# Patient Record
Sex: Female | Born: 1955 | Hispanic: No | Smoking: Former smoker
Health system: Southern US, Community
[De-identification: ages and names within clinical notes are randomized; demographics above are authoritative.]

## PROBLEM LIST (undated history)

## (undated) DIAGNOSIS — D693 Immune thrombocytopenic purpura: Secondary | ICD-10-CM

## (undated) DIAGNOSIS — I10 Essential (primary) hypertension: Secondary | ICD-10-CM

## (undated) DIAGNOSIS — M329 Systemic lupus erythematosus, unspecified: Secondary | ICD-10-CM

## (undated) DIAGNOSIS — E785 Hyperlipidemia, unspecified: Secondary | ICD-10-CM

## (undated) DIAGNOSIS — L98499 Non-pressure chronic ulcer of skin of other sites with unspecified severity: Secondary | ICD-10-CM

## (undated) HISTORY — DX: Immune thrombocytopenic purpura: D69.3

## (undated) HISTORY — DX: Hyperlipidemia, unspecified: E78.5

---

## 2009-12-14 ENCOUNTER — Encounter: Payer: Self-pay | Admitting: Cardiovascular Disease

## 2009-12-29 ENCOUNTER — Encounter: Payer: Self-pay | Admitting: Cardiovascular Disease

## 2010-01-21 ENCOUNTER — Ambulatory Visit: Payer: Self-pay | Admitting: Oncology

## 2010-02-01 ENCOUNTER — Encounter: Payer: Self-pay | Admitting: Cardiovascular Disease

## 2010-02-01 LAB — CBC WITH DIFFERENTIAL/PLATELET
BASO%: 0.6 % (ref 0.0–2.0)
Eosinophils Absolute: 0.1 10*3/uL (ref 0.0–0.5)
MONO#: 0.3 10*3/uL (ref 0.1–0.9)
MONO%: 9.1 % (ref 0.0–14.0)
NEUT#: 1.6 10*3/uL (ref 1.5–6.5)
RBC: 4.69 10*6/uL (ref 3.70–5.45)
RDW: 13.6 % (ref 11.2–14.5)
WBC: 3.8 10*3/uL — ABNORMAL LOW (ref 3.9–10.3)

## 2010-02-01 LAB — CHCC SMEAR

## 2010-02-01 LAB — COMPREHENSIVE METABOLIC PANEL
AST: 23 U/L (ref 0–37)
BUN: 14 mg/dL (ref 6–23)
Calcium: 9.3 mg/dL (ref 8.4–10.5)
Chloride: 104 mEq/L (ref 96–112)
Creatinine, Ser: 0.94 mg/dL (ref 0.40–1.20)
Glucose, Bld: 102 mg/dL — ABNORMAL HIGH (ref 70–99)

## 2010-02-01 LAB — SEDIMENTATION RATE: Sed Rate: 14 mm/hr (ref 0–22)

## 2010-02-01 LAB — MORPHOLOGY: RBC Comments: NORMAL

## 2010-02-03 ENCOUNTER — Encounter: Payer: Self-pay | Admitting: Cardiovascular Disease

## 2010-02-18 ENCOUNTER — Encounter: Payer: Self-pay | Admitting: Cardiovascular Disease

## 2010-02-25 ENCOUNTER — Ambulatory Visit: Payer: Self-pay | Admitting: Oncology

## 2010-03-23 DIAGNOSIS — R079 Chest pain, unspecified: Secondary | ICD-10-CM

## 2010-03-23 DIAGNOSIS — R0602 Shortness of breath: Secondary | ICD-10-CM

## 2010-03-23 DIAGNOSIS — R609 Edema, unspecified: Secondary | ICD-10-CM

## 2010-03-23 DIAGNOSIS — I10 Essential (primary) hypertension: Secondary | ICD-10-CM | POA: Insufficient documentation

## 2010-03-23 DIAGNOSIS — B354 Tinea corporis: Secondary | ICD-10-CM | POA: Insufficient documentation

## 2010-03-28 ENCOUNTER — Ambulatory Visit: Payer: Self-pay | Admitting: Oncology

## 2010-04-15 ENCOUNTER — Encounter (INDEPENDENT_AMBULATORY_CARE_PROVIDER_SITE_OTHER): Payer: Self-pay | Admitting: *Deleted

## 2010-04-27 ENCOUNTER — Ambulatory Visit: Payer: Self-pay | Admitting: Oncology

## 2010-12-13 NOTE — Consult Note (Signed)
Summary: [facility]  [facility]   Imported By: Marylou Mccoy 03/23/2010 10:58:25  _____________________________________________________________________  External Attachment:    Type:   Image     Comment:   External Document

## 2010-12-13 NOTE — Letter (Signed)
Summary: Appointment - Missed  [facility], Main Office  [address]   [place], [place] [address]   Phone: [phone]  Fax: [phone]     April 15, 2010 MRN: [id number]   Laurie Castaneda [address] [place], [place]  [address]   Dear Ms. Yetta Barre,  Our records indicate you missed your appointment on 04/12/10 with Dr. Eden Emms. It is very important that we reach you to reschedule this appointment. We look forward to participating in your health care needs. Please contact us at the number listed above at your earliest convenience to reschedule this appointment.     Sincerely,   [facility] Scheduling Team

## 2010-12-13 NOTE — Consult Note (Signed)
Summary: [facility]  [facility]   Imported By: Marylou Mccoy 03/23/2010 10:59:29  _____________________________________________________________________  External Attachment:    Type:   Image     Comment:   External Document

## 2010-12-13 NOTE — Consult Note (Signed)
Summary: [facility]  [facility]   Imported By: Marylou Mccoy 03/23/2010 10:57:19  _____________________________________________________________________  External Attachment:    Type:   Image     Comment:   External Document

## 2011-03-18 ENCOUNTER — Emergency Department (HOSPITAL_COMMUNITY)
Admission: EM | Admit: 2011-03-18 | Discharge: 2011-03-19 | Disposition: A | Discharge status: Home / Self Care | Payer: Medicaid Other | Attending: Emergency Medicine | Admitting: Emergency Medicine

## 2011-03-18 DIAGNOSIS — D696 Thrombocytopenia, unspecified: Secondary | ICD-10-CM | POA: Insufficient documentation

## 2011-03-18 DIAGNOSIS — I2510 Atherosclerotic heart disease of native coronary artery without angina pectoris: Secondary | ICD-10-CM | POA: Insufficient documentation

## 2011-03-18 DIAGNOSIS — N949 Unspecified condition associated with female genital organs and menstrual cycle: Secondary | ICD-10-CM | POA: Insufficient documentation

## 2011-03-18 DIAGNOSIS — N938 Other specified abnormal uterine and vaginal bleeding: Secondary | ICD-10-CM | POA: Insufficient documentation

## 2011-03-19 LAB — URINALYSIS, ROUTINE W REFLEX MICROSCOPIC
Nitrite: NEGATIVE
Specific Gravity, Urine: 1.018 (ref 1.005–1.030)
Urobilinogen, UA: 0.2 mg/dL (ref 0.0–1.0)

## 2011-03-19 LAB — WET PREP, GENITAL
Trich, Wet Prep: NONE SEEN
Yeast Wet Prep HPF POC: NONE SEEN

## 2011-03-19 LAB — URINE MICROSCOPIC-ADD ON

## 2011-03-19 LAB — BASIC METABOLIC PANEL
BUN: 12 mg/dL (ref 6–23)
Calcium: 9.2 mg/dL (ref 8.4–10.5)
GFR calc non Af Amer: >60 mL/min (ref >60)
Glucose, Bld: 86 mg/dL (ref 70–99)
Sodium: 136 mEq/L (ref 135–145)

## 2011-03-19 LAB — CBC
Platelets: 54 10*3/uL — ABNORMAL LOW (ref 150–400)
RDW: 13.9 % (ref 11.5–15.5)
WBC: 4.4 10*3/uL (ref 4.0–10.5)

## 2011-03-19 LAB — DIFFERENTIAL
Basophils Absolute: 0 10*3/uL (ref 0.0–0.1)
Eosinophils Absolute: 0.1 10*3/uL (ref 0.0–0.7)
Eosinophils Relative: 1 % (ref 0–5)

## 2011-04-05 ENCOUNTER — Other Ambulatory Visit: Payer: Self-pay | Admitting: Nephrology

## 2011-04-05 ENCOUNTER — Ambulatory Visit
Admission: RE | Admit: 2011-04-05 | Discharge: 2011-04-05 | Disposition: A | Discharge status: Home / Self Care | Payer: Medicaid Other | Source: Ambulatory Visit | Attending: Nephrology | Admitting: Nephrology

## 2011-04-05 DIAGNOSIS — M25562 Pain in left knee: Secondary | ICD-10-CM

## 2011-04-05 DIAGNOSIS — M25561 Pain in right knee: Secondary | ICD-10-CM

## 2011-06-09 IMAGING — CR DG KNEE 1-2V*R*
3 series · 3 of 3 positions shown · non-contrast
Comparison: None.

CLINICAL DATA: Knee pain

RIGHT KNEE - 1-2 VIEW

[view not recorded (1 of 3)]
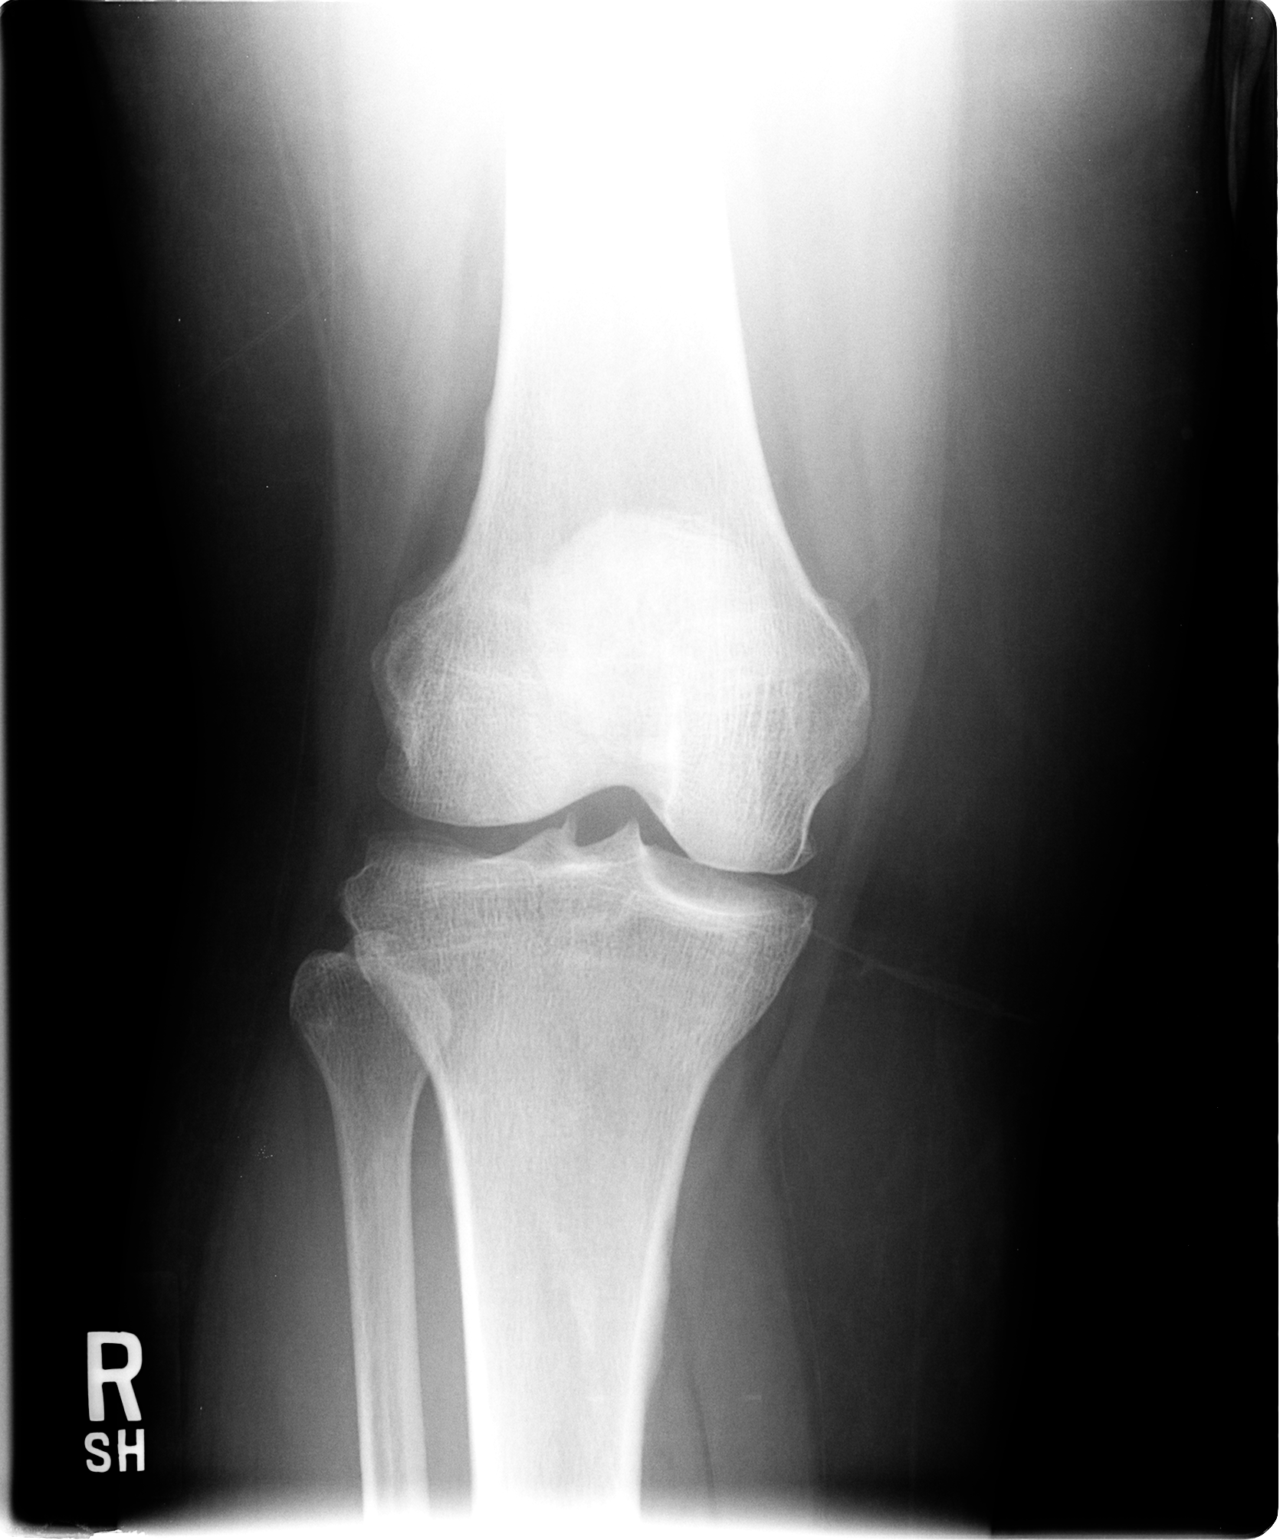

[view not recorded (2 of 3)]
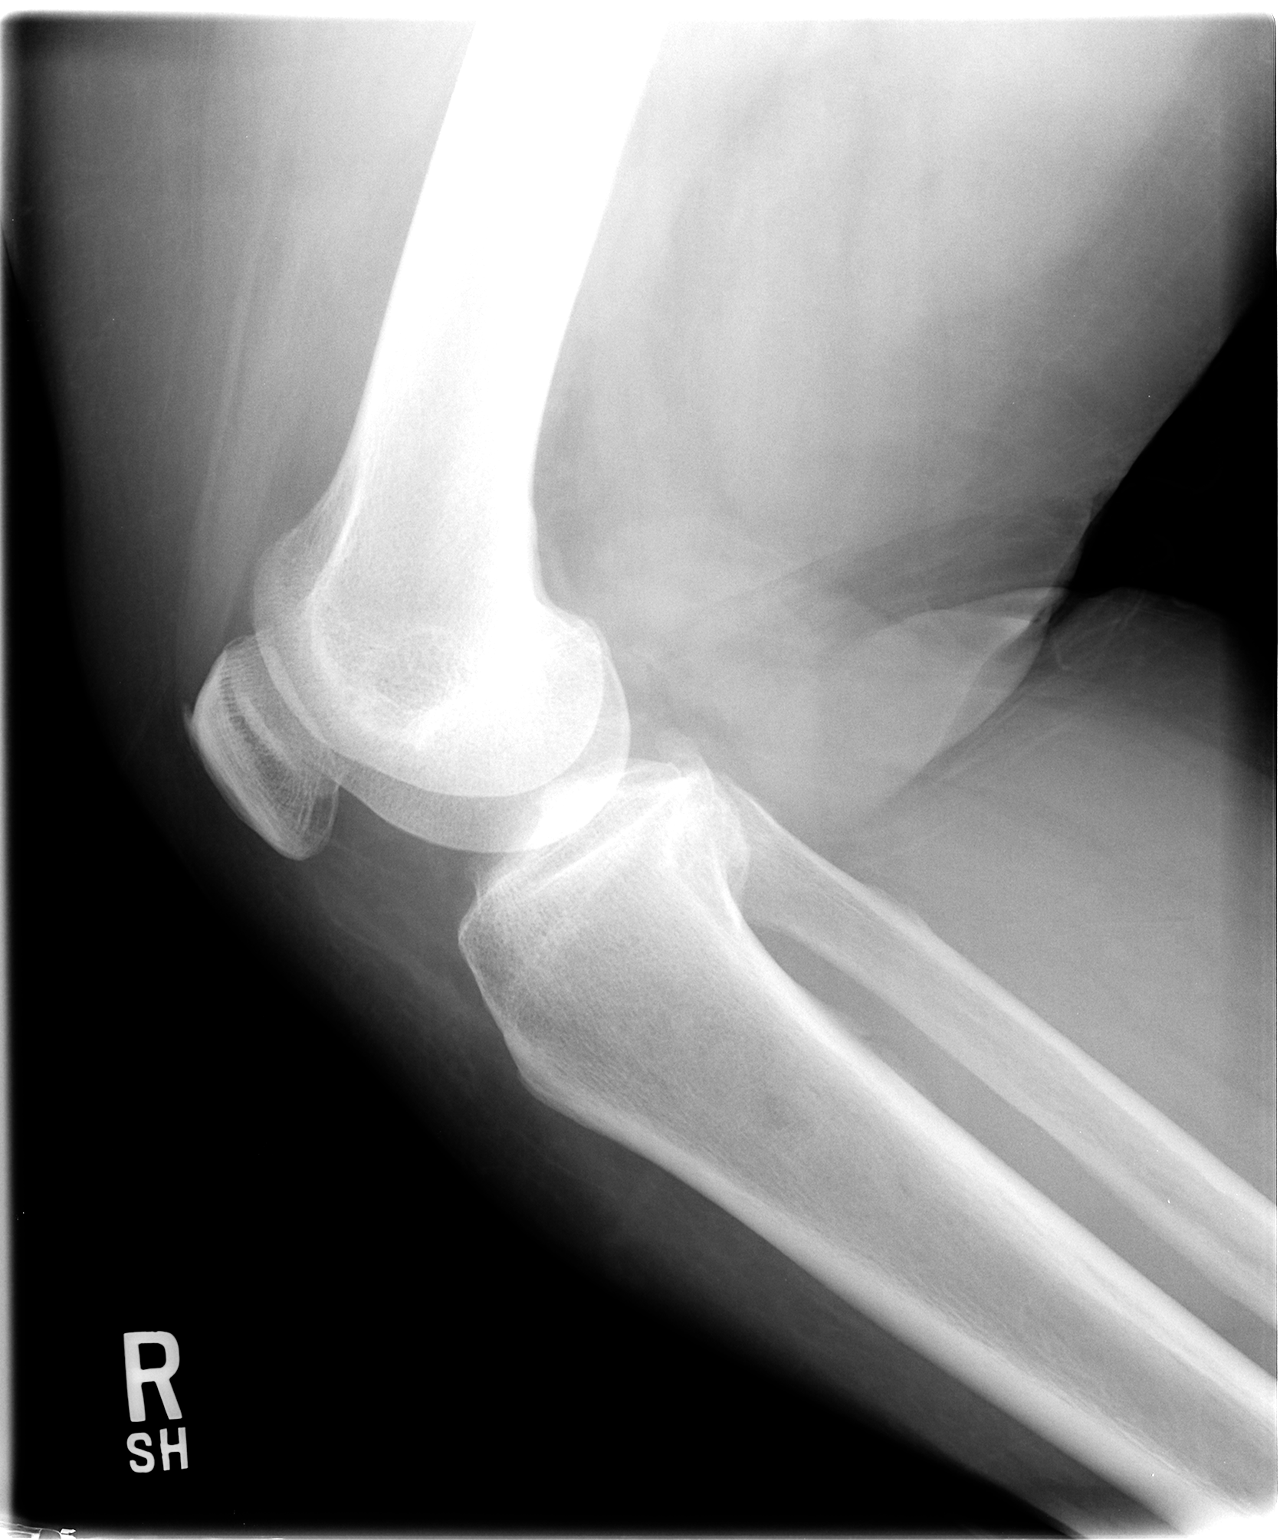

[view not recorded (3 of 3)]
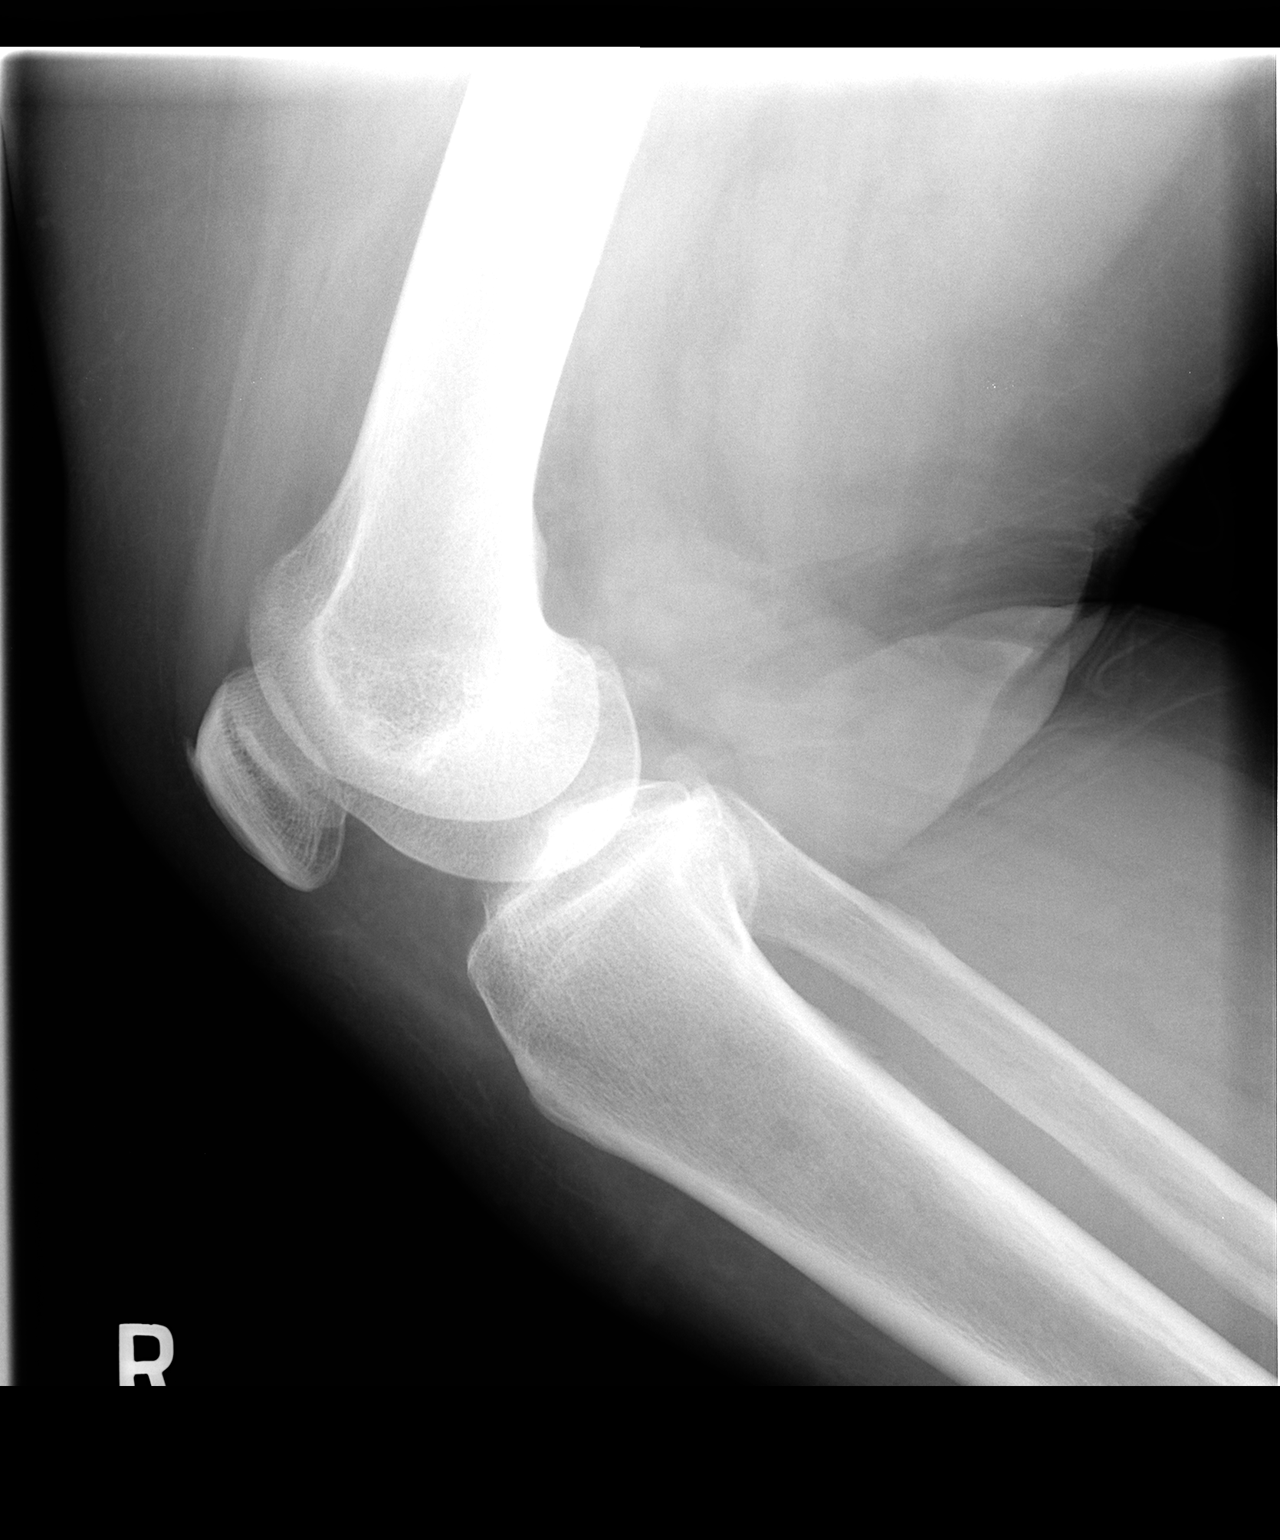

[3 of 3 positions shown; findings below may reference images not displayed]

FINDINGS: There is narrowing of the medial compartment with
associated osteophytosis.  There is mild spurring of the superior
aspect of the patella.  No joint effusion
IMPRESSION: Mild osteoarthritis of the medial compartment and patellofemoral
compartment.

## 2018-10-28 ENCOUNTER — Encounter (HOSPITAL_COMMUNITY): Payer: Self-pay | Admitting: Emergency Medicine

## 2018-10-28 ENCOUNTER — Emergency Department (HOSPITAL_COMMUNITY)
Admission: EM | Admit: 2018-10-28 | Discharge: 2018-10-28 | Disposition: A | Discharge status: Home / Self Care | Payer: Medicaid Other | Attending: Emergency Medicine | Admitting: Emergency Medicine

## 2018-10-28 DIAGNOSIS — M25561 Pain in right knee: Secondary | ICD-10-CM | POA: Insufficient documentation

## 2018-10-28 DIAGNOSIS — M3210 Systemic lupus erythematosus, organ or system involvement unspecified: Secondary | ICD-10-CM | POA: Diagnosis not present

## 2018-10-28 DIAGNOSIS — I10 Essential (primary) hypertension: Secondary | ICD-10-CM | POA: Diagnosis not present

## 2018-10-28 HISTORY — DX: Systemic lupus erythematosus, unspecified: M32.9

## 2018-10-28 HISTORY — DX: Non-pressure chronic ulcer of skin of other sites with unspecified severity: L98.499

## 2018-10-28 HISTORY — DX: Essential (primary) hypertension: I10

## 2018-10-28 MED ORDER — HYDROCODONE-ACETAMINOPHEN 5-325 MG PO TABS: 1.0000 | ORAL_TABLET | ORAL | 0 refills | Status: DC | PRN

## 2018-10-28 NOTE — Discharge Instructions (Addendum)
Please read attached information. If you experience any new or worsening signs or symptoms please return to the emergency room for evaluation. Please follow-up with your primary care provider or specialist as discussed. Please use medication prescribed only as directed and discontinue taking if you have any concerning signs or symptoms.   °

## 2018-10-28 NOTE — ED Notes (Signed)
Pt stable, ambulatory, states understanding of discharge instructions 

## 2018-10-28 NOTE — ED Provider Notes (Signed)
[facility] Provider Note   CSN: [id number] Arrival date & time: 10/28/18  1222     History   Chief Complaint Chief Complaint  Patient presents with  . Knee Injury    HPI Laurie Burrowvelyn Rawe is a 62 y.o. female.  HPI   62 year old female presents today with complaints of right-sided knee pain.  Patient notes 2 weeks of worsening right knee pain worse with ambulation.  She notes history of arthritis in the [facility].  She denies any recent trauma, fever, warmth or redness.  She notes she has nearly full flexion in the knee.  Patient notes a significant past medical history of lupus.  Past Medical History:  Diagnosis Date  . Hypertension   . Lupus (HCC)   . Ulcer of abdomen wall [facility](HCC)     Patient Active Problem List   Diagnosis Date Noted  . TINEA CORPORIS 03/23/2010  . HYPERTENSION 03/23/2010  . EDEMA 03/23/2010  . SHORTNESS OF BREATH 03/23/2010  . CHEST PAIN 03/23/2010    History reviewed. No pertinent surgical history.   OB History   No obstetric history on file.      Home Medications    Prior to Admission medications   Medication Sig Start Date End Date Taking? Authorizing Provider  HYDROcodone-acetaminophen (NORCO/VICODIN) 5-325 MG tablet Take 1 tablet by mouth every 4 (four) hours as needed. 10/28/18   Eyvonne MechanicHedges, Jaliza Seifried, PA-C    Family History History reviewed. No pertinent family history.  Social History Social History   Tobacco Use  . Smoking status: Not on file  Substance Use Topics  . Alcohol use: Not on file  . Drug use: Not on file     Allergies   Shellfish allergy   Review of Systems Review of Systems  All other systems reviewed and are negative.   Physical Exam Updated Vital Signs BP (!) 172/100 (BP Location: Right Arm)   Pulse 93   Temp 98.4 F (36.9 C) (Oral)   Resp 18   SpO2 99%   Physical Exam Vitals signs and nursing note reviewed.    Constitutional:      Appearance: She is well-developed.  HENT:     Head: Normocephalic and atraumatic.  Eyes:     General: No scleral icterus.       Right eye: No discharge.        Left eye: No discharge.     Conjunctiva/sclera: Conjunctivae normal.     Pupils: Pupils are equal, round, and reactive to light.  Neck:     Musculoskeletal: Normal range of motion.     Vascular: No JVD.     Trachea: No tracheal deviation.  Pulmonary:     Effort: Pulmonary effort is normal.     Breath sounds: No stridor.  Musculoskeletal:     Comments: Bilateral knees atraumatic without swelling or edema, no significant tenderness to palpation, some pain with range of motion greater than 90 degrees of flexion noted no laxity noted  Neurological:     Mental Status: She is alert and oriented to person, place, and time.     Coordination: Coordination normal.  Psychiatric:        Behavior: Behavior normal.        Thought Content: Thought content normal.        Judgment: Judgment normal.     ED Treatments / Results  Labs (all labs ordered are listed, but only abnormal results  are displayed) Labs Reviewed - No data to display  EKG None  Radiology No results found.  Procedures Procedures (including critical care time)  Medications Ordered in ED Medications - No data to display   Initial Impression / Assessment and Plan / ED Course  I have reviewed the triage vital signs and the nursing notes.  Pertinent labs & imaging results that were available during my care of the patient were reviewed by me and considered in my medical decision making (see chart for details).      Assessment/Plan: 62 year old female presents today with complaints of right-sided knee pain.  I have high suspicion for arthritis.  She does have x-rays from years past with signs of arthritis.  No recent falls no signs of infection likely worsening arthritic pain.  Patient is having difficulty sleeping at night secondary to  the pain I do find it reasonable to give her a short course of pain medication as she attempts to follow-up as an outpatient with orthopedic specialist.  No signs of septic arthritis, strict return precautions given.  Verbalized understanding and agreement to today's plan.  Discussed imaging versus not having meeting with the patient she will have imaging as an outpatient as this is nonemergent.    Final Clinical Impressions(s) / ED Diagnoses   Final diagnoses:  Right knee pain, unspecified chronicity    ED Discharge Orders         Ordered    HYDROcodone-acetaminophen (NORCO/VICODIN) 5-325 MG tablet  Every 4 hours PRN     10/28/18 1350           Eyvonne Mechanic, PA-C 10/28/18 1502    Eber Hong, MD 10/28/18 Ernestina [place]

## 2018-10-28 NOTE — ED Triage Notes (Signed)
Pt arrives pov with c/o of right knee pain for the last 2 weeks-pt reports no recent injury. Pt reports pain started in back of knee and feels swollen. Pt is ambulatory but reports increase pain with walking and steps.

## 2022-06-02 ENCOUNTER — Ambulatory Visit: Payer: Medicaid Other | Admitting: Cardiology

## 2022-06-02 ENCOUNTER — Encounter: Payer: Self-pay | Admitting: Cardiology

## 2022-06-02 ENCOUNTER — Inpatient Hospital Stay: Payer: Medicaid Other

## 2022-06-02 VITALS — BP 132/86 | HR 86 | Temp 98.0°F | Resp 16 | Ht 65.0 in | Wt 282.0 lb

## 2022-06-02 DIAGNOSIS — E7800 Pure hypercholesterolemia, unspecified: Secondary | ICD-10-CM

## 2022-06-02 DIAGNOSIS — I10 Essential (primary) hypertension: Secondary | ICD-10-CM

## 2022-06-02 DIAGNOSIS — IMO0002 Reserved for concepts with insufficient information to code with codable children: Secondary | ICD-10-CM

## 2022-06-02 DIAGNOSIS — R9431 Abnormal electrocardiogram [ECG] [EKG]: Secondary | ICD-10-CM

## 2022-06-02 DIAGNOSIS — D693 Immune thrombocytopenic purpura: Secondary | ICD-10-CM

## 2022-06-02 DIAGNOSIS — M329 Systemic lupus erythematosus, unspecified: Secondary | ICD-10-CM

## 2022-06-02 NOTE — Progress Notes (Signed)
ID:  Laurie Castaneda, DOB 07/16/1956, MRN [id number]  PCP:  Default, Provider, MD  Cardiologist:  Tessa Lerner, DO, [facility] (established care 06/02/2022)  REASON FOR CONSULT: Evaluation of Atrial Fibrillation  REQUESTING PHYSICIAN:  Norm Salt, PA [address] [place],  [place] [address]  Chief Complaint  Patient presents with   Atrial Fibrillation   Hypertension   New Patient (Initial Visit)    Referred by Norva Riffle, PA    HPI  Laurie Castaneda is a 66 y.o. African-American female who presents to the clinic for evaluation of atrial fibrillation at the request of Norm Salt, [place]. Her past medical history and cardiovascular risk factors include: Prediabetes, hyperlipidemia, idiopathic thrombocytopenic purpura, systemic lupus, hypertension, obesity, former smoker.   Patient states that she has been experiencing palpitations more recently predominately at night, describes it as " shaking inside."  The symptoms last for a few minutes, no change in intensity, frequency, and the duration.  Patient denies any near-syncope or syncopal event.  She denies anginal discomfort or heart failure symptoms.  Patient states that recently she been drinking a significant amount of alcohol.  She was drinking 1/5 of vodka at least every 4 days or more.  She has not been cooking on a regular basis and has been eating TV dinners and junk food according to her.  She recently went to her PCP and given her symptoms had an EKG performed and was diagnosed with atrial fibrillation.  She was started on Eliquis for thromboembolic prophylaxis; however, has not started anticoagulation given her underlying ITP and the risk for bleeding.  FUNCTIONAL STATUS: No structured exercise program or daily routine.   ALLERGIES: Allergies  Allergen Reactions   Shellfish Allergy     MEDICATION LIST PRIOR TO VISIT: Current Meds  Medication Sig   amLODipine (NORVASC) 10 MG tablet Take 10 mg by mouth daily.    fluticasone (FLONASE) 50 MCG/ACT nasal spray Place 1 spray into both nostrils daily.   hydrOXYzine (ATARAX) 25 MG tablet Take 1-2 tablets by mouth in the morning and at bedtime.   omeprazole (PRILOSEC) 20 MG capsule Take 20 mg by mouth daily.   VENTOLIN HFA 108 (90 Base) MCG/ACT inhaler Inhale 2 puffs into the lungs 4 (four) times daily.     PAST MEDICAL HISTORY: Past Medical History:  Diagnosis Date   Diabetes mellitus without complication (HCC)    Hyperlipidemia    Hypertension    Idiopathic thrombocytopenic purpura (ITP) (HCC)    Lupus (HCC)    Ulcer of abdomen wall (HCC)     PAST SURGICAL HISTORY: History reviewed. No pertinent surgical history.  FAMILY HISTORY: The patient family history includes Cancer in her father.  SOCIAL HISTORY:  The patient  reports that she quit smoking about 14 years ago. Her smoking use included cigarettes. She has a 2.00 pack-year smoking history. She has never used smokeless tobacco. She reports that she does not currently use alcohol. She reports that she does not use drugs.  REVIEW OF SYSTEMS: Review of Systems  Cardiovascular:  Negative for chest pain, claudication, dyspnea on exertion, irregular heartbeat, leg swelling, near-syncope, orthopnea, palpitations, paroxysmal nocturnal dyspnea and syncope.  Respiratory:  Negative for shortness of breath.   Hematologic/Lymphatic: Negative for bleeding problem.  Musculoskeletal:  Negative for muscle cramps and myalgias.  Neurological:  Negative for dizziness and light-headedness.    PHYSICAL EXAM:    06/02/2022   11:16 AM 06/02/2022   11:13 AM 10/28/2018    1:51 PM  Vitals with BMI  Height  5\' 5"    Weight  282 lbs   BMI  46.93   Systolic 132 170  Diastolic 86 96 100  Pulse 86 87 93    CONSTITUTIONAL: Well-developed and well-nourished. No acute distress.  SKIN: Skin is warm and dry. No rash noted. No cyanosis. No pallor. No jaundice HEAD: Normocephalic and atraumatic.  EYES: No  scleral icterus, xanthelasmas bilaterally MOUTH/THROAT: Moist oral membranes.  NECK: No JVD present. No thyromegaly noted. No carotid bruits  CHEST Normal respiratory effort. No intercostal retractions  LUNGS: Clear to auscultation bilaterally.  No stridor. No wheezes. No rales.  CARDIOVASCULAR: Regular rate and rhythm, positive S1-S2, no murmurs rubs or gallops appreciated. ABDOMINAL: Obese, soft, nontender, nondistended, positive bowel sounds in all 4 quadrants, no apparent ascites.  EXTREMITIES: No peripheral edema, warm to touch. HEMATOLOGIC: No significant bruising NEUROLOGIC: Oriented to person, place, and time. Nonfocal. Normal muscle tone.  PSYCHIATRIC: Normal mood and affect. Normal behavior. Cooperative  CARDIAC DATABASE: EKG outside records:   EKG: 06/02/2022: Normal sinus rhythm, 86 bpm, without underlying ischemia injury pattern.  Echocardiogram: No results found for this or any previous visit from the past 1095 days.    Stress Testing: No results found for this or any previous visit from the past 1095 days.   Heart Catheterization: None  LABORATORY DATA:    Latest Ref Rng & Units 03/18/2011   11:59 PM 02/01/2010    1:43 PM  CBC  WBC 4.0 - 10.5 K/uL 4.4  3.8   Hemoglobin 12.0 - 15.0 g/dL 02/03/2010  27.0   Hematocrit 36.0 - 46.0 % 40.0  39.2   Platelets 150 - 400 K/uL 54  74        Latest Ref Rng & Units 03/18/2011   11:59 PM 02/01/2010    1:43 PM  CMP  Glucose 70 - 99 mg/dL 86  02/03/2010   BUN 6 - 23 mg/dL 12  14   Creatinine 0.4 - 1.2 mg/dL 093  8.18   Sodium 2.99 - 145 mEq/L 136  142   Potassium 3.5 - 5.1 mEq/L 3.6  4.1   Chloride 96 - 112 mEq/L 102  104   CO2 19 - 32 mEq/L 25  27   Calcium 8.4 - 10.5 mg/dL 9.2  9.3   Total Protein 6.0 - 8.3 g/dL  7.0   Total Bilirubin 0.3 - 1.2 mg/dL  0.4   Alkaline Phos 39 - 117 U/L  69   AST 0 - 37 U/L  23   ALT 0 - 35 U/L  20     Lipid Panel  No results found for: "CHOL", "TRIG", "HDL", "CHOLHDL", "VLDL", "LDLCALC",  "LDLDIRECT", "LABVLDL"  No components found for: "NTPROBNP" No results for input(s): "PROBNP" in the last 8760 hours. No results for input(s): "TSH" in the last 8760 hours.  BMP No results for input(s): "NA", "K", "CL", "CO2", "GLUCOSE", "BUN", "CREATININE", "CALCIUM", "GFRNONAA", "GFRAA" in the last 8760 hours.  HEMOGLOBIN A1C No results found for: "HGBA1C", "MPG"  External Labs: Collected: 05/24/2022 Total cholesterol 200, triglyceride 98, HDL 52, LDL 128, non-HDL 148 BUN 9, creatinine 07/25/2022. Sodium 140, potassium 4.4, chloride 103, AST 20, ALT 16, alkaline phosphatase 63. Hemoglobin 13.3 g/dL, hematocrit 6.96. A1c 6.4. TSH 2.25   IMPRESSION:    ICD-10-CM   1. Abnormal EKG  R94.31 EKG 12-Lead    PCV ECHOCARDIOGRAM COMPLETE    LONG TERM MONITOR (3-14 DAYS)    2. Benign hypertension  I10     3. Hypercholesteremia  E78.00 CT CARDIAC SCORING ([facility] LOCATIONS ONLY)    4. Lupus (HCC)  M32.9     5. Chronic ITP (idiopathic thrombocytopenia) (HCC)  D69.3        RECOMMENDATIONS: Laurie Castaneda is a 66 y.o. African-American female whose past medical history and cardiac risk factors include: Prediabtes, Non-insulin-dependent diabetes mellitus type 2, hyperlipidemia, idiopathic thrombocytopenic purpura, systemic lupus, hypertension, obesity.   Abnormal EKG Patient was referred to the practice for evaluation and management of atrial fibrillation. EKG performed at PCP office reviewed independently and it notes sinus rhythm with frequent PVCs & not Afib. Given her ITP she has not started Eliquis and the shared decision was to hold off for now.  EKG from PCP office noted above for reference.  14-day extended Holter monitor to evaluate for A-fib burden.  Echo will be ordered to evaluate for structural heart disease and left ventricular systolic function. To help reduce the incidence of atrial fibrillation patient is educated on the importance of weight loss, decreasing her alcohol  consumption, being screened for sleep apnea, and improving her modifiable cardiovascular risk factors. I will defer referring her to sleep medicine to PCP.  Benign hypertension Office BP are well controlled.  Medications reconciled.   Hypercholesteremia On physical examination she has bilateral xanthelasmas image uploaded into the media section. 10-year risk of ASCVD is 9%.   Most recent lipid profile reviewed. Patient is hesitant to be on statin medications. Therefore we will order a coronary calcium score for further risk stratification.  FINAL MEDICATION LIST END OF ENCOUNTER: No orders of the defined types were placed in this encounter.   Medications Discontinued During This Encounter  Medication Reason   HYDROcodone-acetaminophen (NORCO/VICODIN) 5-325 MG tablet    ELIQUIS 5 MG TABS tablet Patient Preference     Current Outpatient Medications:    amLODipine (NORVASC) 10 MG tablet, Take 10 mg by mouth daily., Disp: , Rfl:    fluticasone (FLONASE) 50 MCG/ACT nasal spray, Place 1 spray into both nostrils daily., Disp: , Rfl:    hydrOXYzine (ATARAX) 25 MG tablet, Take 1-2 tablets by mouth in the morning and at bedtime., Disp: , Rfl:    omeprazole (PRILOSEC) 20 MG capsule, Take 20 mg by mouth daily., Disp: , Rfl:    VENTOLIN HFA 108 (90 Base) MCG/ACT inhaler, Inhale 2 puffs into the lungs 4 (four) times daily., Disp: , Rfl:    acetaminophen-codeine (TYLENOL #3) 300-30 MG tablet, Take 1 tablet by mouth every 6 (six) hours as needed., Disp: , Rfl:    azithromycin (ZITHROMAX) 250 MG tablet, Take 250 mg by mouth as directed. (Patient not taking: Reported on 06/02/2022), Disp: , Rfl:   Orders Placed This Encounter  Procedures   CT CARDIAC SCORING ([facility] LOCATIONS ONLY)   LONG TERM MONITOR (3-14 DAYS)   EKG 12-Lead   PCV ECHOCARDIOGRAM COMPLETE    There are no Patient Instructions on file for this visit.   --Continue cardiac medications as reconciled in final medication  list. --Return in about 6 weeks (around 07/14/2022) for Follow up, Palpitations, Review test results. or sooner if needed. --Continue follow-up with your primary care physician regarding the management of your other chronic comorbid conditions.  Patient's questions and concerns were addressed to her satisfaction. She voices understanding of the instructions provided during this encounter.   This note was created using a voice recognition software as a result there may be grammatical errors inadvertently enclosed that do not reflect the  nature of this encounter. Every attempt is made to correct such errors.  Rex Kras, [place], [facility]  Pager: [phone] Office: [phone]

## 2022-07-10 NOTE — Progress Notes (Signed)
Front desk will call him to make him an appt

## 2022-07-13 ENCOUNTER — Ambulatory Visit: Payer: Medicaid Other | Admitting: Cardiology

## 2022-07-31 ENCOUNTER — Encounter: Payer: Self-pay | Admitting: Cardiology

## 2022-07-31 ENCOUNTER — Ambulatory Visit: Payer: Medicaid Other | Admitting: Cardiology

## 2022-07-31 VITALS — BP 128/72 | HR 89 | Temp 97.0°F | Resp 16 | Ht 65.0 in | Wt 280.0 lb

## 2022-07-31 DIAGNOSIS — R7303 Prediabetes: Secondary | ICD-10-CM

## 2022-07-31 DIAGNOSIS — I4729 Other ventricular tachycardia: Secondary | ICD-10-CM

## 2022-07-31 DIAGNOSIS — I493 Ventricular premature depolarization: Secondary | ICD-10-CM

## 2022-07-31 DIAGNOSIS — E7800 Pure hypercholesterolemia, unspecified: Secondary | ICD-10-CM

## 2022-07-31 DIAGNOSIS — I10 Essential (primary) hypertension: Secondary | ICD-10-CM

## 2022-07-31 NOTE — Progress Notes (Signed)
ID:  Laurie Castaneda, DOB 10-Apr-1956, MRN [id number]  PCP:  Default, Provider, MD  Cardiologist:  Rex Kras, DO, [facility] (established care 06/02/2022)  Date: 07/31/22 Last Office Visit: 06/02/2022  Chief Complaint  Patient presents with   Palpitations   Results   Follow-up    HPI  Laurie Castaneda is a 66 y.o. African-American female whose past medical history and cardiovascular risk factors include: Prediabetes, hyperlipidemia, idiopathic thrombocytopenic purpura, systemic lupus, hypertension, obesity, former smoker.   This patient is accompanied in the office by her  daughter . Laurie Castaneda provides verbal consent with regards to having her present during today's encounter.  She was referred to the practice by her PCP for evaluation of possible atrial fibrillation given her symptoms of palpitations.  Reviewed the outside EKG provided to me during the initial consultation which noted normal sinus rhythm with frequent PVCs.  In the last office visit she was recommended to undergo cardiac monitor and echo to evaluate for underlying dysrhythmias in LVEF and structural heart disease respectively.  Echocardiogram is still pending.  Patient was drinking 1/5 of vodka at least every 4 days or more; however, since last office visit patient states she probably has had 1 L liquor since July 2023.  Her palpitations are quite infrequent still present.  Denies anginal discomfort or heart failure symptoms.  Denies near-syncope or syncopal events.  FUNCTIONAL STATUS: No structured exercise program or daily routine.   ALLERGIES: Allergies  Allergen Reactions   Shellfish Allergy     MEDICATION LIST PRIOR TO VISIT: Current Meds  Medication Sig   acetaminophen-codeine (TYLENOL #3) 300-30 MG tablet Take 1 tablet by mouth every 6 (six) hours as needed.   amLODipine (NORVASC) 10 MG tablet Take 10 mg by mouth daily.   fluticasone (FLONASE) 50 MCG/ACT nasal spray Place 1 spray into both nostrils daily.    hydrOXYzine (ATARAX) 25 MG tablet Take 1-2 tablets by mouth in the morning and at bedtime.   omeprazole (PRILOSEC) 20 MG capsule Take 20 mg by mouth daily.   VENTOLIN HFA 108 (90 Base) MCG/ACT inhaler Inhale 2 puffs into the lungs 4 (four) times daily.     PAST MEDICAL HISTORY: Past Medical History:  Diagnosis Date   Hyperlipidemia    Hypertension    Idiopathic thrombocytopenic purpura (ITP) (HCC)    Lupus (HCC)    Ulcer of abdomen wall ([place])     PAST SURGICAL HISTORY: History reviewed. No pertinent surgical history.  FAMILY HISTORY: The patient family history includes Cancer in her father.  SOCIAL HISTORY:  The patient  reports that she quit smoking about 14 years ago. Her smoking use included cigarettes. She has a 2.00 pack-year smoking history. She has never used smokeless tobacco. She reports that she does not currently use alcohol. She reports that she does not use drugs.  REVIEW OF SYSTEMS: Review of Systems  Cardiovascular:  Positive for palpitations. Negative for chest pain, claudication, dyspnea on exertion, irregular heartbeat, leg swelling, near-syncope, orthopnea, paroxysmal nocturnal dyspnea and syncope.  Respiratory:  Negative for shortness of breath.   Hematologic/Lymphatic: Negative for bleeding problem.  Musculoskeletal:  Negative for muscle cramps and myalgias.  Neurological:  Negative for dizziness and light-headedness.    PHYSICAL EXAM:    07/31/2022    3:35 PM 06/02/2022   11:16 AM 06/02/2022   11:13 AM  Vitals with BMI  Height 5\' 5"   5\' 5"   Weight 280 lbs  282 lbs  BMI 46.96  29.52  Systolic 841 324  170  Diastolic 72 86 96  Pulse 89 86 87    CONSTITUTIONAL: Well-developed and well-nourished. No acute distress.  SKIN: Skin is warm and dry. No rash noted. No cyanosis. No pallor. No jaundice HEAD: Normocephalic and atraumatic.  EYES: No scleral icterus, xanthelasmas bilaterally MOUTH/THROAT: Moist oral membranes.  NECK: No JVD present. No  thyromegaly noted. No carotid bruits  CHEST Normal respiratory effort. No intercostal retractions  LUNGS: Clear to auscultation bilaterally.  No stridor. No wheezes. No rales.  CARDIOVASCULAR: Regular rate and rhythm, positive S1-S2, no murmurs rubs or gallops appreciated. ABDOMINAL: Obese, soft, nontender, nondistended, positive bowel sounds in all 4 quadrants, no apparent ascites.  EXTREMITIES: No peripheral edema, warm to touch. HEMATOLOGIC: No significant bruising NEUROLOGIC: Oriented to person, place, and time. Nonfocal. Normal muscle tone.  PSYCHIATRIC: Normal mood and affect. Normal behavior. Cooperative  CARDIAC DATABASE: EKG outside records:   EKG: 06/02/2022: Normal sinus rhythm, 86 bpm, without underlying ischemia injury pattern.  Echocardiogram: No results found for this or any previous visit from the past 1095 days.    Stress Testing: No results found for this or any previous visit from the past 1095 days.   Heart Catheterization: None  Cardiac monitor [facility]): June 02, 2022 -June 15, 2022 Dominant rhythm sinus. Heart rate 48-187 bpm. Avg HR 77 bpm. No atrial fibrillation, high grade AV block, pauses (3 seconds or longer). Total ventricular ectopic burden 2.9% (predominantly isolated beats). 1 asymptomatic episode of NSVT, 4 beats, 2.6 seconds, max heart rate 182 bpm, average HR 117 bpm, 06/09/2022, at 4:19 PM. Rare episodes of PSVT. Total supraventricular ectopic burden <1%. Patient triggered events: 1. Underlying rhythm sinus with rare PVCs.   LABORATORY DATA:    Latest Ref Rng & Units 03/18/2011   11:59 PM 02/01/2010    1:43 PM  CBC  WBC 4.0 - 10.5 K/uL 4.4  3.8   Hemoglobin 12.0 - 15.0 g/dL 16.113.4  09.613.4   Hematocrit 36.0 - 46.0 % 40.0  39.2   Platelets 150 - 400 K/uL 54  74        Latest Ref Rng & Units 03/18/2011   11:59 PM 02/01/2010    1:43 PM  CMP  Glucose 70 - 99 mg/dL 86  045102   BUN 6 - 23 mg/dL 12  14   Creatinine 0.4 - 1.2 mg/dL 4.090.61  8.110.94    Sodium 914135 - 145 mEq/L 136  142   Potassium 3.5 - 5.1 mEq/L 3.6  4.1   Chloride 96 - 112 mEq/L 102  104   CO2 19 - 32 mEq/L 25  27   Calcium 8.4 - 10.5 mg/dL 9.2  9.3   Total Protein 6.0 - 8.3 g/dL  7.0   Total Bilirubin 0.3 - 1.2 mg/dL  0.4   Alkaline Phos 39 - 117 U/L  69   AST 0 - 37 U/L  23   ALT 0 - 35 U/L  20     Lipid Panel  No results found for: "CHOL", "TRIG", "HDL", "CHOLHDL", "VLDL", "LDLCALC", "LDLDIRECT", "LABVLDL"  No components found for: "NTPROBNP" No results for input(s): "PROBNP" in the last 8760 hours. No results for input(s): "TSH" in the last 8760 hours.  BMP No results for input(s): "NA", "K", "CL", "CO2", "GLUCOSE", "BUN", "CREATININE", "CALCIUM", "GFRNONAA", "GFRAA" in the last 8760 hours.  HEMOGLOBIN A1C No results found for: "HGBA1C", "MPG"  External Labs: Collected: 05/24/2022 Total cholesterol 200, triglyceride 98, HDL 52, LDL 128, non-HDL 148 BUN 9, creatinine  0.87. Sodium 140, potassium 4.4, chloride 103, AST 20, ALT 16, alkaline phosphatase 63. Hemoglobin 13.3 g/dL, hematocrit 68.3%. A1c 6.4. TSH 2.25   IMPRESSION:    ICD-10-CM   1. Premature ventricular contraction  I49.3 PCV MYOCARDIAL PERFUSION WITH LEXISCAN    2. NSVT (nonsustained ventricular tachycardia) (HCC)  I47.29 PCV MYOCARDIAL PERFUSION WITH LEXISCAN    3. Benign hypertension  I10     4. Hypercholesteremia  E78.00     5. Prediabetes  R73.03        RECOMMENDATIONS: Ysenia Filice is a 66 y.o. African-American female whose past medical history and cardiac risk factors include: Prediabtes, hyperlipidemia, idiopathic thrombocytopenic purpura, systemic lupus, hypertension, obesity.   Referred to the practice for evaluation of atrial fibrillation.  Based on EKG reviewed during initial consultation the underlying rhythm was not A-fib but normal sinus rhythm with frequent PVCs.  Given her EKG findings and symptoms of palpitations shared decision was to proceed with a cardiac  monitor.  Cardiac monitor notes that underlying rhythm to be sinus with an average heart rate of 77 bpm.  She has a PVC burden of approximately 2.9% and had 1 asymptomatic episode of NSVT lasting 2.6 seconds.  Patient triggered event noted underlying rhythm to be sinus with rare PVCs.  Echocardiogram is still pending.  Recommended transitioning her amlodipine to a different antihypertensive medications and starting Cardizem 120 mg p.o. daily.  However patient is hesitant to be on medical therapy (does not want to be on something life long).   Given her cardiovascular risk factors, and NSVT noted on cardiac monitor recommend the importance of proceeding with an echocardiogram as originally discussed and exercise nuclear stress test.  Patient is unable to exercise and therefore the shared decision was to do a 2-day Lexiscan protocol.  Patient has history of prediabetes, hypercholesterolemia, elevated 10-year risk of ASCVD greater than 7.5%, and physical examination of bilateral xanthelasmas.  I recommended statin therapy in the past.  She still is hesitant to be on statin therapy.  I also ordered a coronary calcium score for further restratification but there is too is pending.  We will defer further management to PCP.  Reemphasized importance of complete alcohol cessation.  FINAL MEDICATION LIST END OF ENCOUNTER: No orders of the defined types were placed in this encounter.   Medications Discontinued During This Encounter  Medication Reason   azithromycin (ZITHROMAX) 250 MG tablet      Current Outpatient Medications:    acetaminophen-codeine (TYLENOL #3) 300-30 MG tablet, Take 1 tablet by mouth every 6 (six) hours as needed., Disp: , Rfl:    amLODipine (NORVASC) 10 MG tablet, Take 10 mg by mouth daily., Disp: , Rfl:    fluticasone (FLONASE) 50 MCG/ACT nasal spray, Place 1 spray into both nostrils daily., Disp: , Rfl:    hydrOXYzine (ATARAX) 25 MG tablet, Take 1-2 tablets by mouth in the  morning and at bedtime., Disp: , Rfl:    omeprazole (PRILOSEC) 20 MG capsule, Take 20 mg by mouth daily., Disp: , Rfl:    VENTOLIN HFA 108 (90 Base) MCG/ACT inhaler, Inhale 2 puffs into the lungs 4 (four) times daily., Disp: , Rfl:   Orders Placed This Encounter  Procedures   PCV MYOCARDIAL PERFUSION WITH LEXISCAN    There are no Patient Instructions on file for this visit.   --Continue cardiac medications as reconciled in final medication list. --Return in about 8 weeks (around 09/25/2022) for Follow up PVC, Review test results. or sooner if needed. --Continue  follow-up with your primary care physician regarding the management of your other chronic comorbid conditions.  Patient's questions and concerns were addressed to her satisfaction. She voices understanding of the instructions provided during this encounter.   This note was created using a voice recognition software as a result there may be grammatical errors inadvertently enclosed that do not reflect the nature of this encounter. Every attempt is made to correct such errors.  Tessa Lerner, [place], [facility]  Pager: [phone] Office: [phone]

## 2022-08-01 ENCOUNTER — Telehealth: Payer: Self-pay | Admitting: Cardiology

## 2022-08-02 NOTE — Telephone Encounter (Signed)
Please document her wishes and will work with her if she changes her mind.   Dr. Terri Skains

## 2022-08-04 ENCOUNTER — Other Ambulatory Visit: Payer: Medicaid Other

## 2022-09-25 ENCOUNTER — Ambulatory Visit: Payer: Medicaid Other | Admitting: Cardiology
